# Patient Record
Sex: Male | Born: 1970 | Race: White | Hispanic: No | Marital: Married | Smoking: Never smoker
Health system: Southern US, Community
[De-identification: ages and names within clinical notes are randomized; demographics above are authoritative.]

## PROBLEM LIST (undated history)

## (undated) DIAGNOSIS — N200 Calculus of kidney: Secondary | ICD-10-CM

## (undated) HISTORY — PX: HERNIA REPAIR: SHX51

## (undated) HISTORY — PX: APPENDECTOMY: SHX54

## (undated) HISTORY — PX: KIDNEY STONE SURGERY: SHX686

---

## 2001-05-09 ENCOUNTER — Encounter: Payer: Self-pay | Admitting: Urology

## 2001-05-09 ENCOUNTER — Ambulatory Visit (HOSPITAL_COMMUNITY): Admission: RE | Admit: 2001-05-09 | Discharge: 2001-05-09 | Payer: Self-pay | Admitting: Urology

## 2004-09-02 ENCOUNTER — Ambulatory Visit: Payer: Self-pay | Admitting: Family Medicine

## 2005-04-21 ENCOUNTER — Emergency Department (HOSPITAL_COMMUNITY): Admission: EM | Admit: 2005-04-21 | Discharge: 2005-04-22 | Payer: Self-pay | Admitting: Emergency Medicine

## 2005-05-25 ENCOUNTER — Ambulatory Visit: Payer: Self-pay | Admitting: Family Medicine

## 2008-10-23 ENCOUNTER — Ambulatory Visit: Payer: Self-pay | Admitting: Urology

## 2009-04-23 ENCOUNTER — Ambulatory Visit: Payer: Self-pay | Admitting: Urology

## 2009-11-05 ENCOUNTER — Ambulatory Visit: Payer: Self-pay | Admitting: Urology

## 2010-06-19 IMAGING — CR DG ABDOMEN 1V
1 series · 2 of 2 positions shown · non-contrast
Comparison: none

REASON FOR EXAM: Nephrolithiasis
COMMENTS:

[Series 1: view not recorded · 0.17mm/px · 2 of 2 slices shown]
[im 1/2]
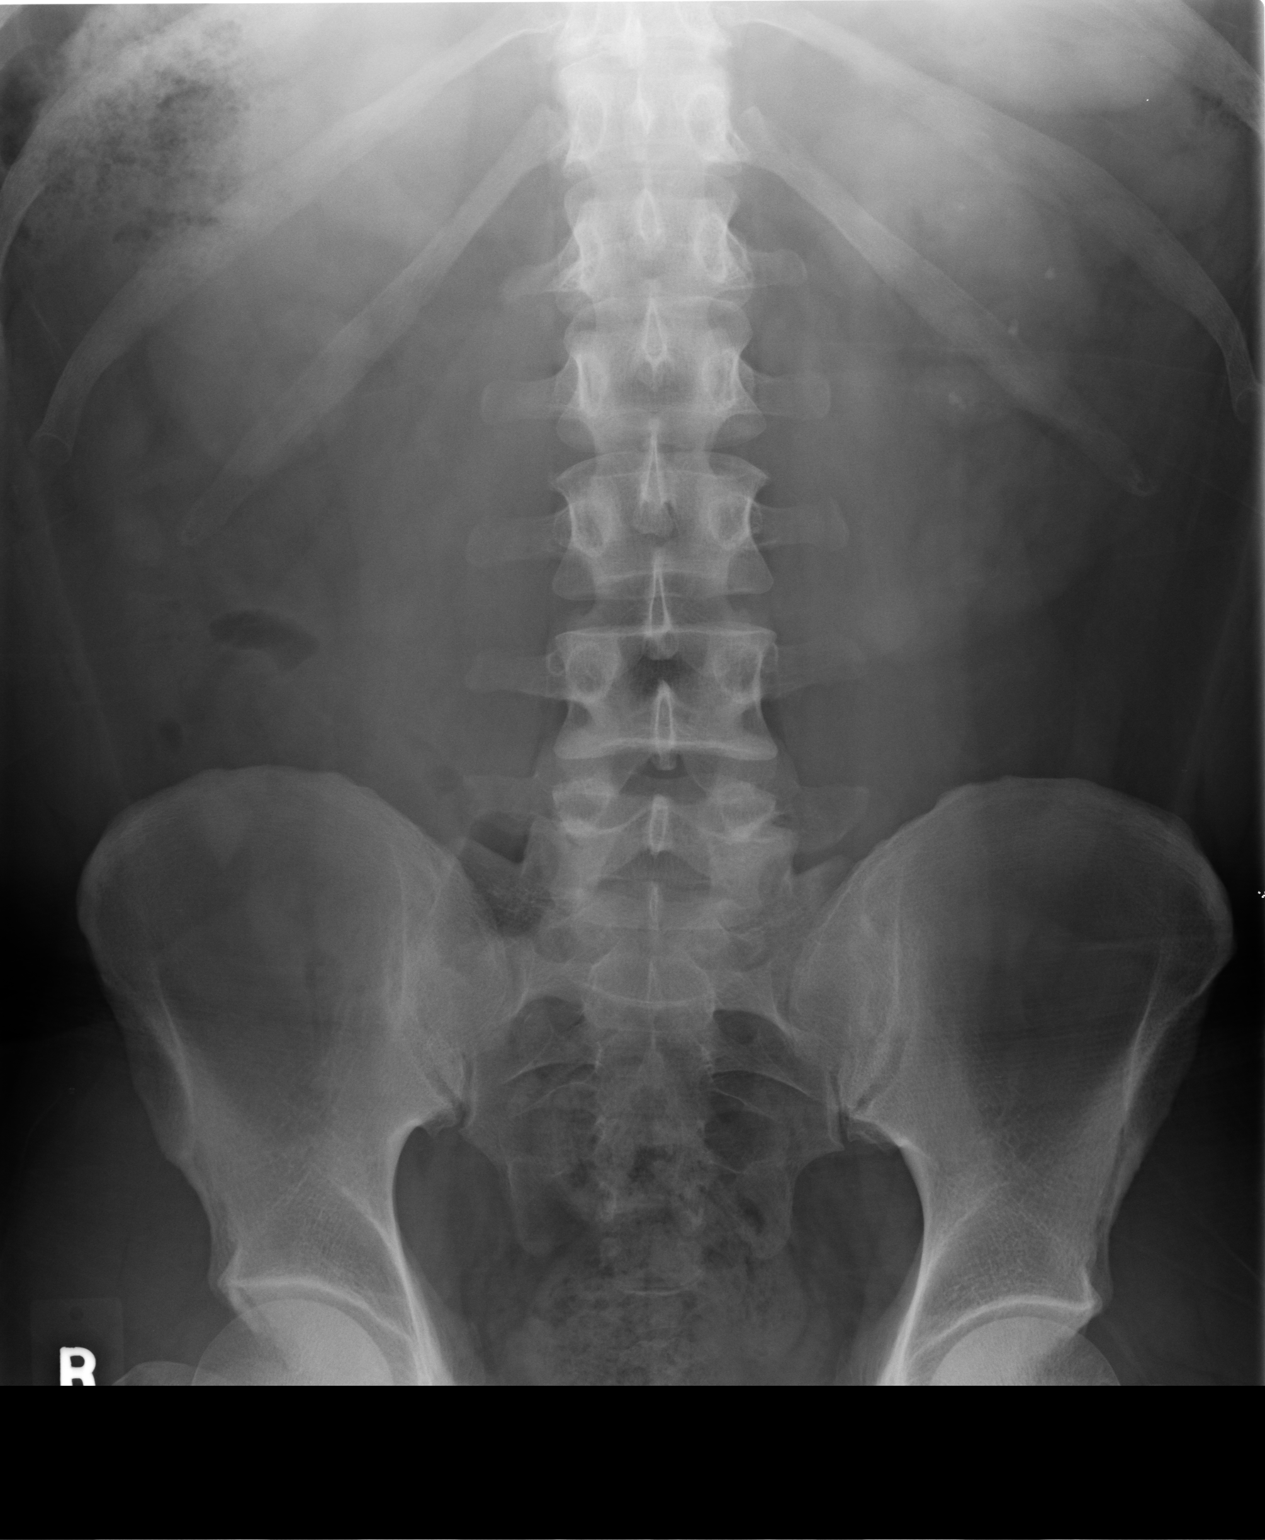
[im 2/2]
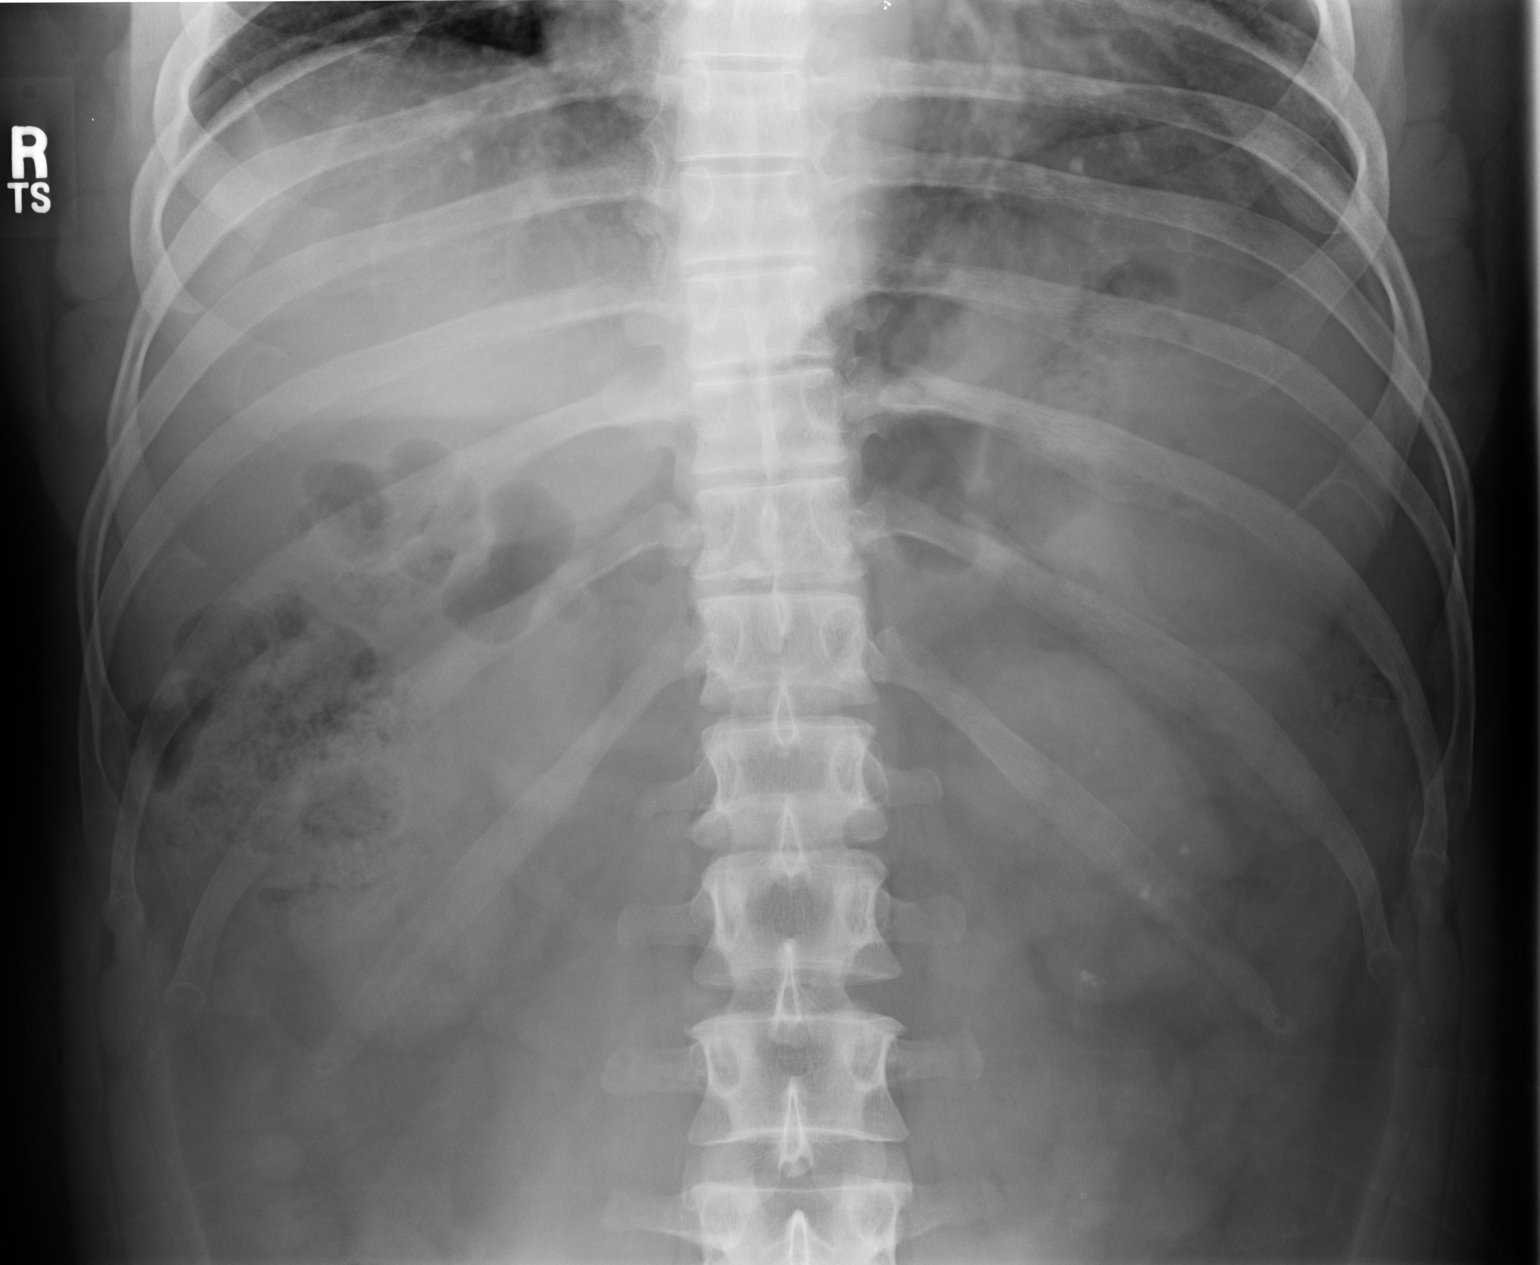

[2 of 2 positions shown; findings below may reference images not displayed]

PROCEDURE:     DXR - DXR KIDNEY URETER BLADDER  - November 05, 2009  [DATE]

RESULT:     Comparison is made to the study 23 April, 2009.

There remain approximately 4 to 5 stones over the left kidney. These measure
no more than approximately 2 mm in diameter. On the right I do not see
definite stones. The left kidney may be atrophic. The gas pattern in the
bowel is normal and the bony structures appear normal.
IMPRESSION: There remain calcifications involving the collecting system
of the left kidney both in the upper and lower poles. I do not see definite
stones involving the right kidney.

## 2010-08-08 ENCOUNTER — Ambulatory Visit: Payer: Self-pay | Admitting: Urology

## 2017-08-24 DIAGNOSIS — R7303 Prediabetes: Secondary | ICD-10-CM | POA: Diagnosis not present

## 2017-08-24 DIAGNOSIS — Z0000 Encounter for general adult medical examination without abnormal findings: Secondary | ICD-10-CM | POA: Diagnosis not present

## 2017-08-24 DIAGNOSIS — I10 Essential (primary) hypertension: Secondary | ICD-10-CM | POA: Diagnosis not present

## 2017-08-24 DIAGNOSIS — K210 Gastro-esophageal reflux disease with esophagitis: Secondary | ICD-10-CM | POA: Diagnosis not present

## 2017-09-03 DIAGNOSIS — J0100 Acute maxillary sinusitis, unspecified: Secondary | ICD-10-CM | POA: Diagnosis not present

## 2017-11-10 DIAGNOSIS — Z79899 Other long term (current) drug therapy: Secondary | ICD-10-CM | POA: Diagnosis not present

## 2018-02-22 DIAGNOSIS — K219 Gastro-esophageal reflux disease without esophagitis: Secondary | ICD-10-CM | POA: Diagnosis not present

## 2018-02-22 DIAGNOSIS — I10 Essential (primary) hypertension: Secondary | ICD-10-CM | POA: Diagnosis not present

## 2018-02-22 DIAGNOSIS — Z87442 Personal history of urinary calculi: Secondary | ICD-10-CM | POA: Diagnosis not present

## 2018-08-22 DIAGNOSIS — J0100 Acute maxillary sinusitis, unspecified: Secondary | ICD-10-CM | POA: Diagnosis not present

## 2018-09-20 DIAGNOSIS — E782 Mixed hyperlipidemia: Secondary | ICD-10-CM | POA: Diagnosis not present

## 2018-09-20 DIAGNOSIS — I10 Essential (primary) hypertension: Secondary | ICD-10-CM | POA: Diagnosis not present

## 2018-09-20 DIAGNOSIS — Z87442 Personal history of urinary calculi: Secondary | ICD-10-CM | POA: Diagnosis not present

## 2022-03-26 ENCOUNTER — Emergency Department (HOSPITAL_BASED_OUTPATIENT_CLINIC_OR_DEPARTMENT_OTHER): Payer: 59

## 2022-03-26 ENCOUNTER — Other Ambulatory Visit: Payer: Self-pay

## 2022-03-26 ENCOUNTER — Emergency Department (HOSPITAL_BASED_OUTPATIENT_CLINIC_OR_DEPARTMENT_OTHER)
Admission: EM | Admit: 2022-03-26 | Discharge: 2022-03-26 | Disposition: A | Discharge status: Home / Self Care | Payer: 59 | Attending: Emergency Medicine | Admitting: Emergency Medicine

## 2022-03-26 ENCOUNTER — Encounter (HOSPITAL_BASED_OUTPATIENT_CLINIC_OR_DEPARTMENT_OTHER): Payer: Self-pay | Admitting: Emergency Medicine

## 2022-03-26 DIAGNOSIS — R002 Palpitations: Secondary | ICD-10-CM | POA: Diagnosis present

## 2022-03-26 DIAGNOSIS — I493 Ventricular premature depolarization: Secondary | ICD-10-CM | POA: Insufficient documentation

## 2022-03-26 DIAGNOSIS — E876 Hypokalemia: Secondary | ICD-10-CM | POA: Insufficient documentation

## 2022-03-26 HISTORY — DX: Calculus of kidney: N20.0

## 2022-03-26 LAB — BASIC METABOLIC PANEL
Anion gap: 8 (ref 5–15)
BUN: 16 mg/dL (ref 6–20)
CO2: 28 mmol/L (ref 22–32)
Calcium: 9.2 mg/dL (ref 8.9–10.3)
Chloride: 104 mmol/L (ref 98–111)
Creatinine, Ser: 1.36 mg/dL — ABNORMAL HIGH (ref 0.61–1.24)
GFR, Estimated: >60 mL/min (ref >60)
Glucose, Bld: 107 mg/dL — ABNORMAL HIGH (ref 70–99)
Potassium: 3 mmol/L — ABNORMAL LOW (ref 3.5–5.1)
Sodium: 140 mmol/L (ref 135–145)

## 2022-03-26 LAB — CBC
HCT: 41.8 % (ref 39.0–52.0)
Hemoglobin: 14.9 g/dL (ref 13.0–17.0)
MCH: 30.7 pg (ref 26.0–34.0)
MCHC: 35.6 g/dL (ref 30.0–36.0)
MCV: 86.2 fL (ref 80.0–100.0)
Platelets: 186 10*3/uL (ref 150–400)
RBC: 4.85 MIL/uL (ref 4.22–5.81)
RDW: 12.7 % (ref 11.5–15.5)
WBC: 5.6 10*3/uL (ref 4.0–10.5)
nRBC: 0 % (ref 0.0–0.2)

## 2022-03-26 MED ORDER — POTASSIUM CHLORIDE CRYS ER 20 MEQ PO TBCR: 20.0000 meq | EXTENDED_RELEASE_TABLET | Freq: Two times a day (BID) | ORAL | 0 refills | Status: AC

## 2022-03-26 MED ORDER — POTASSIUM CHLORIDE CRYS ER 20 MEQ PO TBCR
40.0000 meq | EXTENDED_RELEASE_TABLET | Freq: Once | ORAL | Status: AC
  Administered 2022-03-26: 40 meq via ORAL
  Filled 2022-03-26: qty 2

## 2022-03-26 NOTE — ED Provider Notes (Signed)
[facility] Provider Note   CSN: [id number] Arrival date & time: 03/26/22  1952     History  Chief Complaint  Patient presents with   Palpitations    Caleb Scott is a 51 y.o. male history of recurrent kidney stones here presenting with palpitations.  Patient states that 6 days ago he had some palpitations.  He states that it went away.  He states that yesterday he had some more palpitations.  He listened to himself with a stethoscope and noticed that he may have some skipped beats.  He was concerned that he may have atrial fibrillation.  No history of A-fib in the past.  Denies any chest pain.  Patient is on potassium supplements due to persistently low potassium  The history is provided by the patient.       Home Medications Prior to Admission medications   Not on File      Allergies    Pseudoephedrine hcl and Ceftriaxone    Review of Systems   Review of Systems  Cardiovascular:  Positive for palpitations.  All other systems reviewed and are negative.   Physical Exam Updated Vital Signs BP (!) 174/111 (BP Location: Left Arm)   Pulse 77   Temp 98.2 F (36.8 C) (Oral)   Resp 16   Ht 5\' 8"  (1.727 m)   Wt 100.2 kg   SpO2 99%   BMI 33.60 kg/m  Physical Exam Vitals and nursing note reviewed.  HENT:     Head: Normocephalic.     Nose: Nose normal.     Mouth/Throat:     Mouth: Mucous membranes are moist.  Eyes:     Extraocular Movements: Extraocular movements intact.     Pupils: Pupils are equal, round, and reactive to light.  Cardiovascular:     Rate and Rhythm: Normal rate and regular rhythm.     Pulses: Normal pulses.     Heart sounds: Normal heart sounds.  Pulmonary:     Effort: Pulmonary effort is normal.     Breath sounds: Normal breath sounds.  Abdominal:     General: Abdomen is flat.     Palpations: Abdomen is soft.  Musculoskeletal:        General: Normal range of motion.     Cervical back: Normal range of motion  and neck supple.  Skin:    General: Skin is warm.     Capillary Refill: Capillary refill takes less than 2 seconds.  Neurological:     General: No focal deficit present.     Mental Status: He is alert and oriented to person, place, and time.  Psychiatric:        Mood and Affect: Mood normal.        Behavior: Behavior normal.     ED Results / Procedures / Treatments   Labs (all labs ordered are listed, but only abnormal results are displayed) Labs Reviewed  BASIC METABOLIC PANEL - Abnormal; Notable for the following components:      Result Value   Potassium 3.0 (*)    Glucose, Bld 107 (*)    Creatinine, Ser 1.36 (*)    All other components within normal limits  CBC    EKG EKG Interpretation  Date/Time:  Thursday March 26 2022 20:02:59 EDT Ventricular Rate:  78 PR Interval:  170 QRS Duration: 104 QT Interval:  390 QTC Calculation: 444 R Axis:   43 Text Interpretation: Sinus rhythm with sinus arrhythmia with occasional Premature ventricular complexes  Cannot rule out Anterior infarct , age undetermined Abnormal ECG No previous ECGs available Confirmed by Wandra Arthurs [id number]) on 03/26/2022 8:59:09 PM  Radiology DG Chest 2 View  Result Date: 03/26/2022 CLINICAL DATA:  Palpitations. EXAM: CHEST - 2 VIEW COMPARISON:  None Available. FINDINGS: The heart size and mediastinal contours are within normal limits. Both lungs are clear. The visualized skeletal structures are unremarkable. IMPRESSION: No active cardiopulmonary disease. Electronically Signed   By: Virgina Norfolk M.D.   On: 03/26/2022 20:30    Procedures Procedures    Medications Ordered in ED Medications  potassium chloride SA (KLOR-CON M) CR tablet 40 mEq (has no administration in time range)    ED Course/ Medical Decision Making/ A&P                           Medical Decision Making Caleb Scott is a 51 y.o. male here presenting with palpitations.  Patient appears to be in sinus rhythm currently.  However on  his EKG he has occasional PVCs.  I do not see atrial fibrillation currently.  Plan to get CBC and BMP.   9:15 PM Patient's potassium is 3.0.  Patient has chronic hypokalemia is already on potassium 20 meq BID.  He was given a dose of potassium in the ED. I will increase his potassium to 40 meq BID for 3 days.  We will have him follow-up with cardiology to get a Holter monitor and will also get repeat labs.   Problems Addressed: Hypokalemia: acute illness or injury PVC (premature ventricular contraction): acute illness or injury  Amount and/or Complexity of Data Reviewed Labs: ordered. Decision-making details documented in ED Course. Radiology: ordered and independent interpretation performed. Decision-making details documented in ED Course. ECG/medicine tests: ordered and independent interpretation performed. Decision-making details documented in ED Course.  Risk Prescription drug management.    Final Clinical Impression(s) / ED Diagnoses Final diagnoses:  None    Rx / DC Orders ED Discharge Orders     None         Drenda Freeze, MD 03/26/22 2118

## 2022-03-26 NOTE — Discharge Instructions (Addendum)
I have referred you to cardiology to get a Holter monitor.   Your potassium is low so please take extra 20 mEq in the morning and 20 mEq at night for 3 days  You need to recheck your potassium level with your doctor in a week  Return to ER if you have chest pain, trouble breathing, palpitations

## 2022-03-26 NOTE — ED Triage Notes (Signed)
Feels like hear fluttering. Denies pain.

## 2022-04-06 ENCOUNTER — Encounter (HOSPITAL_BASED_OUTPATIENT_CLINIC_OR_DEPARTMENT_OTHER): Payer: Self-pay | Admitting: Emergency Medicine

## 2022-11-07 IMAGING — DX DG CHEST 2V
2 series · 2 of 2 positions shown · non-contrast
Comparison: None Available.

CLINICAL DATA: Palpitations.

EXAM:
CHEST - 2 VIEW

[chest pa]
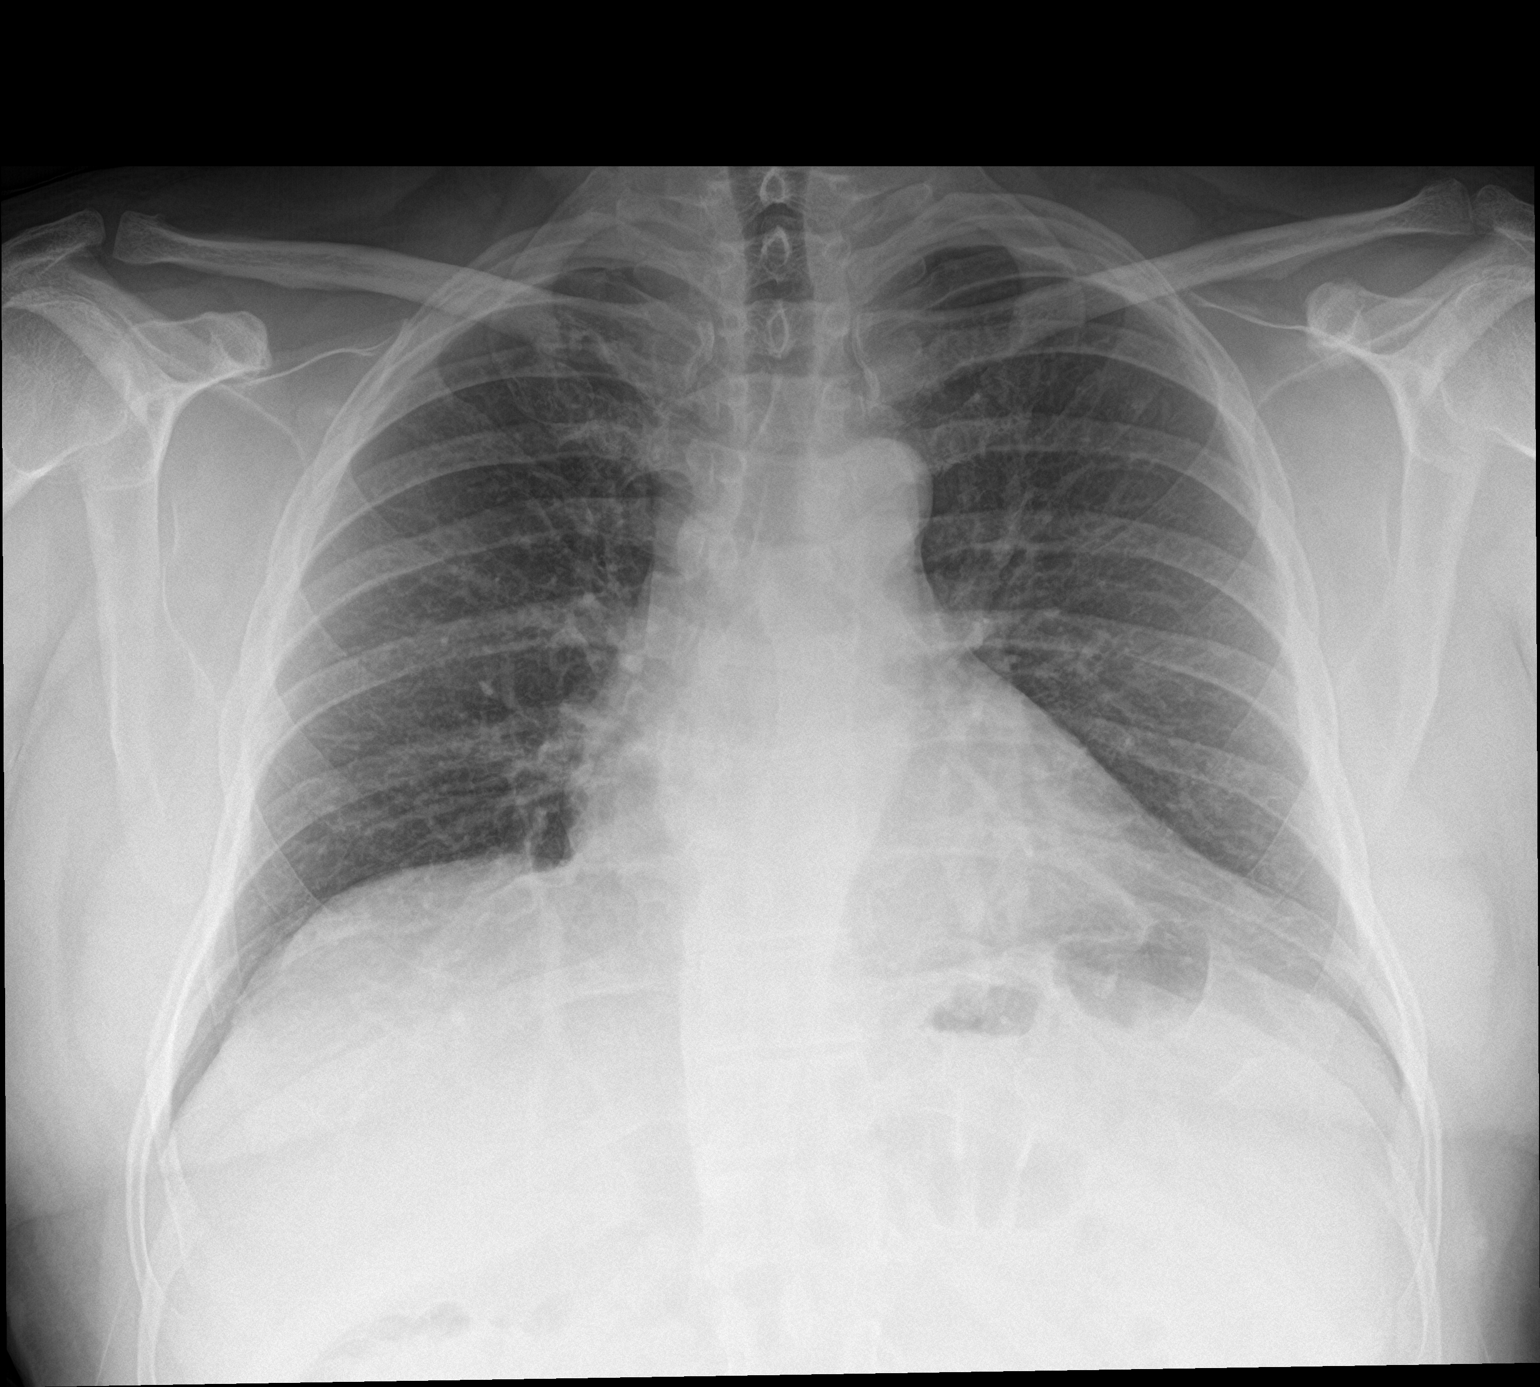

[chest lat]
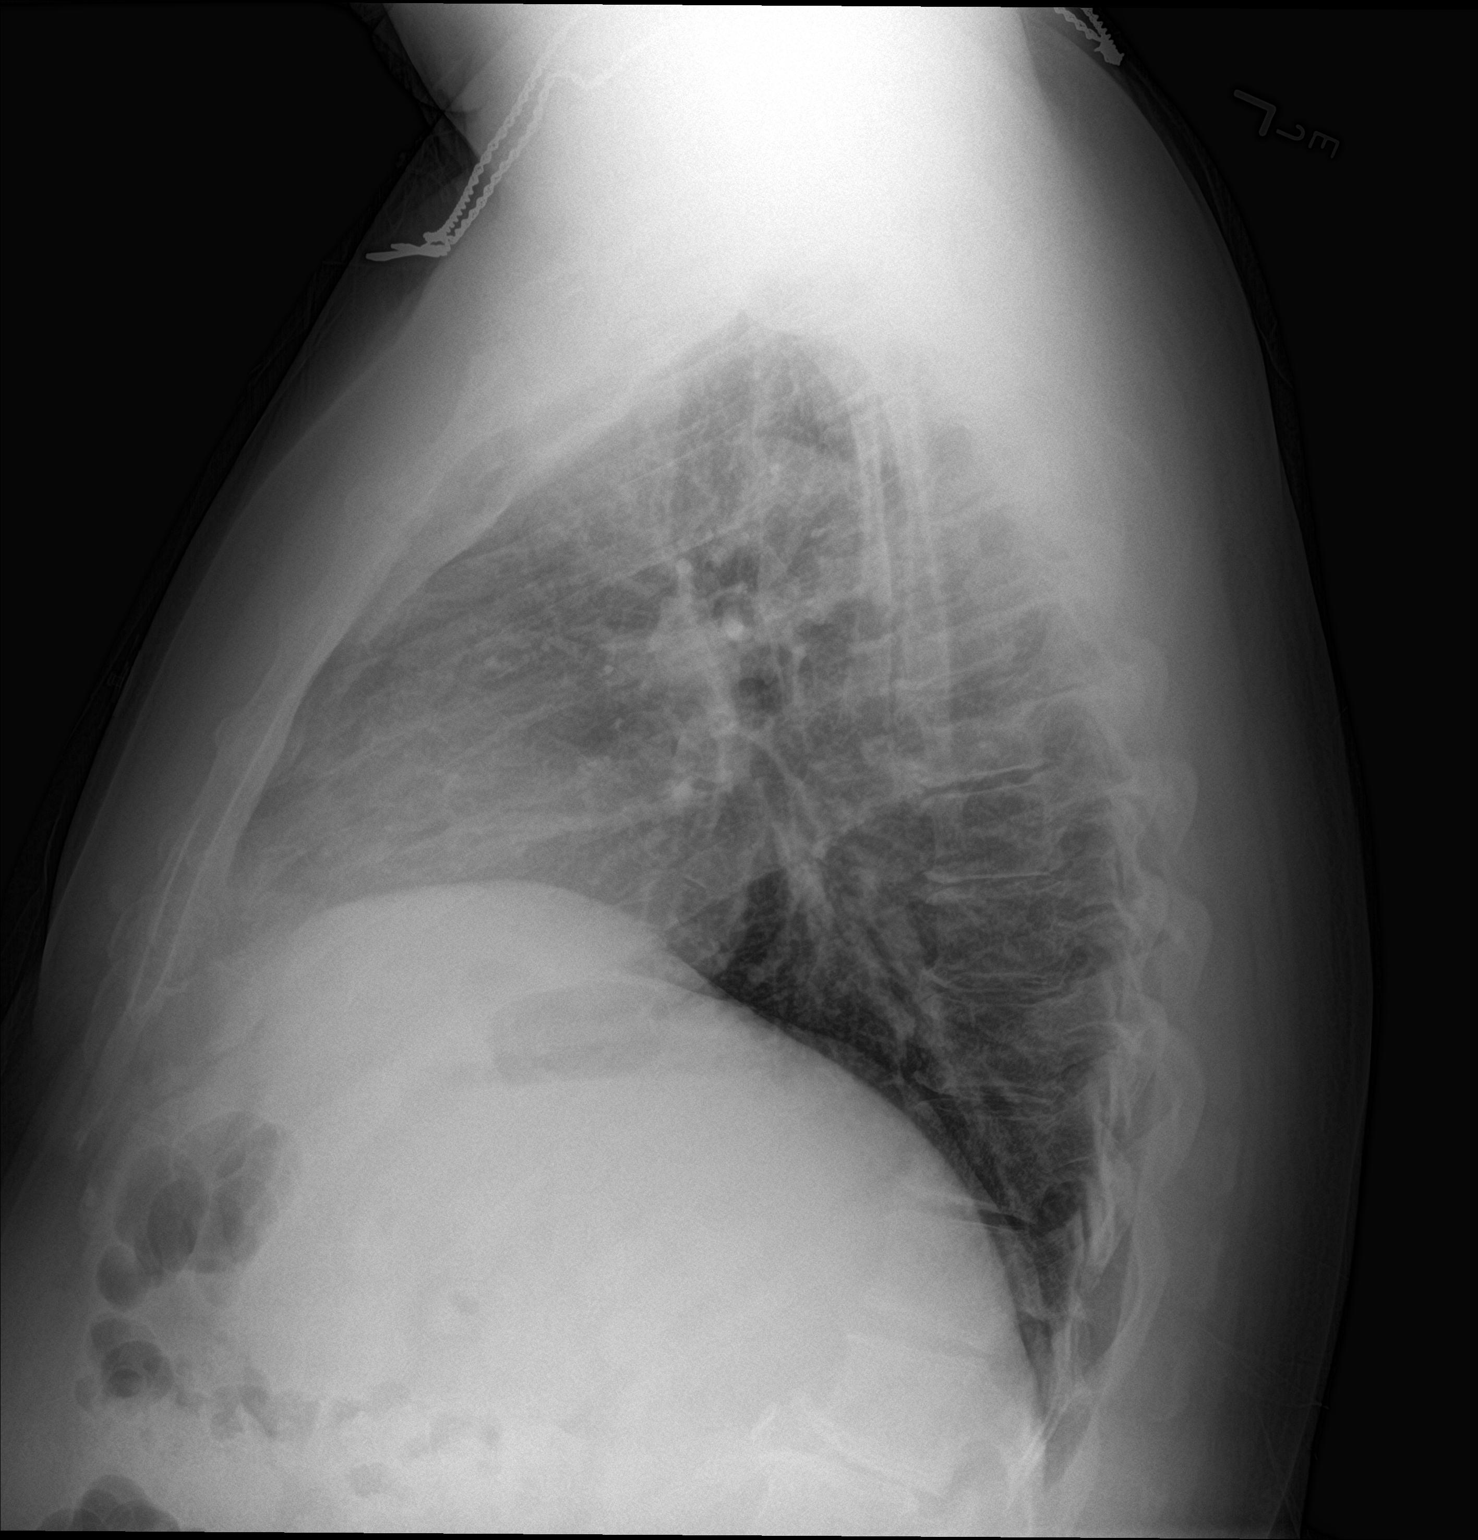

[2 of 2 positions shown; findings below may reference images not displayed]

FINDINGS: The heart size and mediastinal contours are within normal limits.
Both lungs are clear. The visualized skeletal structures are
unremarkable.
IMPRESSION: No active cardiopulmonary disease.
